# Patient Record
Sex: Female | Born: 1985 | Race: Black or African American | Hispanic: No | Marital: Single | Smoking: Never smoker
Health system: Southern US, Community
[De-identification: ages and names within clinical notes are randomized; demographics above are authoritative.]

---
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
## 2009-01-16 ENCOUNTER — Emergency Department (HOSPITAL_COMMUNITY): Admission: EM | Admit: 2009-01-16 | Discharge: 2009-01-16 | Payer: Self-pay | Admitting: Emergency Medicine

## 2018-05-19 ENCOUNTER — Emergency Department (HOSPITAL_COMMUNITY): Payer: Managed Care, Other (non HMO)

## 2018-05-19 ENCOUNTER — Emergency Department (HOSPITAL_COMMUNITY)
Admission: EM | Admit: 2018-05-19 | Discharge: 2018-05-19 | Disposition: A | Discharge status: Home / Self Care | Payer: Managed Care, Other (non HMO) | Attending: Emergency Medicine | Admitting: Emergency Medicine

## 2018-05-19 ENCOUNTER — Encounter (HOSPITAL_COMMUNITY): Payer: Self-pay | Admitting: Emergency Medicine

## 2018-05-19 DIAGNOSIS — E041 Nontoxic single thyroid nodule: Secondary | ICD-10-CM | POA: Diagnosis not present

## 2018-05-19 DIAGNOSIS — R1310 Dysphagia, unspecified: Secondary | ICD-10-CM | POA: Insufficient documentation

## 2018-05-19 LAB — BASIC METABOLIC PANEL
ANION GAP: 8 (ref 5–15)
BUN: 14 mg/dL (ref 6–20)
CALCIUM: 9 mg/dL (ref 8.9–10.3)
CO2: 24 mmol/L (ref 22–32)
CREATININE: 1.01 mg/dL — AB (ref 0.44–1.00)
Chloride: 102 mmol/L (ref 98–111)
GFR calc Af Amer: >60 mL/min (ref >60)
GFR calc non Af Amer: >60 mL/min (ref >60)
Glucose, Bld: 105 mg/dL — ABNORMAL HIGH (ref 70–99)
Potassium: 3.8 mmol/L (ref 3.5–5.1)
Sodium: 134 mmol/L — ABNORMAL LOW (ref 135–145)

## 2018-05-19 LAB — CBC WITH DIFFERENTIAL/PLATELET
Abs Immature Granulocytes: 0 10*3/uL (ref 0.0–0.1)
BASOS PCT: 1 %
Basophils Absolute: 0 10*3/uL (ref 0.0–0.1)
EOS ABS: 0.1 10*3/uL (ref 0.0–0.7)
Eosinophils Relative: 2 %
HEMATOCRIT: 41.1 % (ref 36.0–46.0)
Hemoglobin: 13.2 g/dL (ref 12.0–15.0)
IMMATURE GRANULOCYTES: 0 %
Lymphocytes Relative: 39 %
Lymphs Abs: 2 10*3/uL (ref 0.7–4.0)
MCH: 29.9 pg (ref 26.0–34.0)
MCHC: 32.1 g/dL (ref 30.0–36.0)
MCV: 93.2 fL (ref 78.0–100.0)
MONOS PCT: 13 %
Monocytes Absolute: 0.7 10*3/uL (ref 0.1–1.0)
NEUTROS PCT: 45 %
Neutro Abs: 2.4 10*3/uL (ref 1.7–7.7)
Platelets: 125 10*3/uL — ABNORMAL LOW (ref 150–400)
RBC: 4.41 MIL/uL (ref 3.87–5.11)
RDW: 12.8 % (ref 11.5–15.5)
WBC: 5.2 10*3/uL (ref 4.0–10.5)

## 2018-05-19 LAB — I-STAT BETA HCG BLOOD, ED (MC, WL, AP ONLY): I-stat hCG, quantitative: <5 m[IU]/mL (ref <5)

## 2018-05-19 MED ORDER — IOHEXOL 300 MG/ML  SOLN
75.0000 mL | Freq: Once | INTRAMUSCULAR | Status: AC | PRN
  Administered 2018-05-19: 100 mL via INTRAVENOUS

## 2018-05-19 NOTE — ED Notes (Signed)
Nurse started IV and collected labs. 

## 2018-05-19 NOTE — Discharge Instructions (Signed)
1.  Schedule follow-up appointment with Dr. Jenne PaneBates of ENT. 2.  You should also get established with a family physician.  Use referral number in your discharge instructions to find one. 3.  Return to the emergency department if her symptoms worsen or change.

## 2018-05-19 NOTE — ED Provider Notes (Signed)
MSE was initiated and I personally evaluated the patient and placed orders (if any) at  8:02 PM on May 19, 2018.  The patient appears stable so that the remainder of the MSE may be completed by another provider.  Patient placed in Quick Look pathway, seen and evaluated   Chief Complaint: cyst in throat  HPI:   Patient presents to ED for evaluation of feeling like there is a "cyst in my throat."  She reports having surgery to remove a cyst on her neck approximately 17 years ago.  States that she was told that the cyst was very large.  Today she began having pain with swallowing and feeling like there is something stuck in her throat.  She denies any sore throat, fever, cough, trouble breathing, trouble swallowing, trismus or drooling.  ROS: dysphagia  Physical Exam:   Gen: No distress  Neuro: Awake and Alert  Skin: Warm    Focused Exam: Well-healed scar on anterior neck.  No palpable mass in the area.  No signs of angioedema or anaphylaxis noted.  Tolerating secretions with no trismus, drooling.  No tonsillar enlargement bilaterally.  NAD.   Initiation of care has begun. The patient has been counseled on the process, plan, and necessity for staying for the completion/evaluation, and the remainder of the medical screening examination    Dietrich PatesKhatri, Rodolfo Gaster, PA-C 05/19/18 2004    Bethann BerkshireZammit, Joseph, MD 05/19/18 2140

## 2018-05-19 NOTE — ED Provider Notes (Signed)
[facility] EMERGENCY DEPARTMENT Provider Note   CSN: [id number] Arrival date & time: 05/19/18  1938     History   Chief Complaint Chief Complaint  Patient presents with  . Dysphagia    HPI Patricia Bradshaw is a 32 y.o. female.  HPI Patient will be started to feel a discomfort and lump-like sensation when she is swallowing.  She reports that she has a very distant history of a cyst in her neck that had to be removed.  She reports it feels similar.  No difficulty breathing.  No fever no chills no sore throat.  No earache.  No palpitations.  No unusual weight gain or weight loss.  No undue temperature sensitivity. History reviewed. No pertinent past medical history.  There are no active problems to display for this patient.   Past Surgical History:  Procedure Laterality Date  . CYST REMOVAL NECK  2002   cyst removed from anterior neck     OB History   None      Home Medications    Prior to Admission medications   Not on File    Family History No family history on file.  Social History Social History   Tobacco Use  . Smoking status: Never Smoker  . Smokeless tobacco: Never Used  Substance Use Topics  . Alcohol use: Yes    Frequency: Never    Comment: rare  . Drug use: Never     Allergies   Patient has no known allergies.   Review of Systems Review of Systems 10 Systems reviewed and are negative for acute change except as noted in the HPI.  Physical Exam Updated Vital Signs BP 106/71 (BP Location: Right Arm)   Pulse 78   Temp 98.9 F (37.2 C) (Oral)   Resp 17   Ht 5\' 6"  (1.676 m)   Wt 83.5 kg (184 lb)   LMP 04/28/2018 (Exact Date)   SpO2 100%   BMI 29.70 kg/m   Physical Exam  Constitutional: She is oriented to person, place, and time. She appears well-developed and well-nourished.  Is clinically well in appearance.  She is alert and nontoxic.  No respiratory distress.  Well-nourished well-developed.  HENT:  Head:  Normocephalic and atraumatic.  Posterior oropharynx widely patent.  Voice is normal.  His membranes pink and moist.  Eyes: EOM are normal.  No exophthalmos.  Neck: Neck supple.  Cannot significantly appreciate asymmetry or palpable enlargement of the thyroid.  His voice is clear with no stridor.  Cardiovascular: Normal rate, regular rhythm, normal heart sounds and intact distal pulses.  Pulmonary/Chest: Effort normal and breath sounds normal.  Abdominal: Soft. Bowel sounds are normal. She exhibits no distension. There is no tenderness.  Musculoskeletal: Normal range of motion. She exhibits no edema.  Neurological: She is alert and oriented to person, place, and time. She has normal strength. She exhibits normal muscle tone. Coordination normal. GCS eye subscore is 4. GCS verbal subscore is 5. GCS motor subscore is 6.  Skin: Skin is warm, dry and intact. No rash noted.  skin is in very good condition.  No abnormal dryness.  No rashes.  Psychiatric: She has a normal mood and affect.     ED Treatments / Results  Labs (all labs ordered are listed, but only abnormal results are displayed) Labs Reviewed  BASIC METABOLIC PANEL - Abnormal; Notable for the following components:      Result Value   Sodium 134 (*)    Glucose,  Bld 105 (*)    Creatinine, Ser 1.01 (*)    All other components within normal limits  CBC WITH DIFFERENTIAL/PLATELET - Abnormal; Notable for the following components:   Platelets 125 (*)    All other components within normal limits  I-STAT BETA HCG BLOOD, ED ([facility], [facility], [facility] ONLY)    EKG None  Radiology Ct Soft Tissue Neck W Contrast  Result Date: 05/19/2018 CLINICAL DATA:  32 y/o F; history of cyst removal 17 years ago. Pain with swallowing and feeling like something is stuck in her throat. EXAM: CT NECK WITH CONTRAST TECHNIQUE: Multidetector CT imaging of the neck was performed using the standard protocol following the bolus administration of intravenous contrast.  CONTRAST:  75 cc Omnipaque 300 COMPARISON:  None. FINDINGS: Pharynx and larynx: Normal. No mass or swelling. Salivary glands: No inflammation, mass, or stone. Thyroid: Thyroid nodules measuring up to 4.4 cm in the left lobe of the thyroid. The large nodule in the left lobe of the thyroid exerts mass effect on the airway and esophagus displacing the rightward. Postsurgical changes in the anterior neck adjacent to the thyroid bed. Lymph nodes: None enlarged or abnormal density. Vascular: Negative. Limited intracranial: Negative. Visualized orbits: Negative. Mastoids and visualized paranasal sinuses: Clear. Skeleton: No acute or aggressive process. Upper chest: Negative. Other: None. IMPRESSION: 1. Multiple thyroid nodules measuring up to 4.4 cm in the left lobe of the thyroid. Thyroid ultrasound is recommended on a nonemergent basis. The thyroid nodules exert mass effect on the trachea and esophagus displacing the rightward. 2. Postsurgical changes in the anterior neck adjacent to the thyroid bed, no recurrent cyst identified. Electronically Signed   By: Mitzi HansenLance  Furusawa-Stratton M.D.   On: 05/19/2018 21:38    Procedures Procedures (including critical care time)  Medications Ordered in ED Medications  iohexol (OMNIPAQUE) 300 MG/ML solution 75 mL (100 mLs Intravenous Contrast Given 05/19/18 2104)     Initial Impression / Assessment and Plan / ED Course  I have reviewed the triage vital signs and the nursing notes.  Pertinent labs & imaging results that were available during my care of the patient were reviewed by me and considered in my medical decision making (see chart for details).     Final Clinical Impressions(s) / ED Diagnoses   Final diagnoses:  Thyroid nodule  Dysphagia, unspecified type  Patient presents as outlined above.  CT confirms thyroid nodules in the left lobe with largest up to 4.4 cm.  Patient does not have any positives for hypo-or hyperthyroid on review of systems.   Clinically, she is in good condition.  She is well-nourished and well-developed with normal physical appearance, normal skin, no exophthalmos.  She has not been experiencing palpitations.  He does not have temperature sensitivity.  No evidence of airway compromise.  Patient does not have stridor or difficulty breathing.  He has started to perceive dysphagia.  Patient is counseled on outpatient plan of follow-up with ENT.  Return precautions reviewed.  ED Discharge Orders    None       Arby BarrettePfeiffer, Deondra Wigger, MD 05/19/18 2309

## 2018-05-19 NOTE — ED Triage Notes (Signed)
Patient to ED c/o dysphagia - reports she had a cyst removed from her neck in high school and feels something similar in same area. Reports discomfort and difficulty swallowing. Airway intact, resp e/u.

## 2018-11-15 IMAGING — CT CT NECK W/ CM
4 of 5 series · 15 of 33 positions shown, 18 images · IV contrast (Omni 300)
Comparison: None.

CLINICAL DATA: 32 y/o F; history of cyst removal 17 years ago. Pain
with swallowing and feeling like something is stuck in her throat.

EXAM:
CT NECK WITH CONTRAST
TECHNIQUE: Multidetector CT imaging of the neck was performed using the
standard protocol following the bolus administration of intravenous
contrast.
CONTRAST:  75 cc Omnipaque 300

[Series 3: neck 2.0 st · axial · 0.51mm/px · z∈[-240,-188]mm · 2 of 106 slices shown (1 of 3)]
[im 27/106  bone]
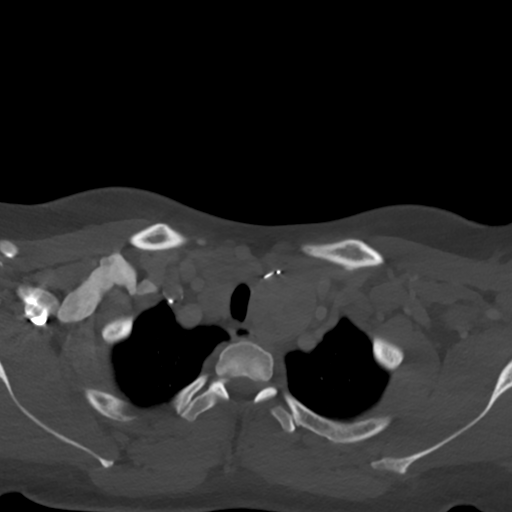
[im 53/106  bone]
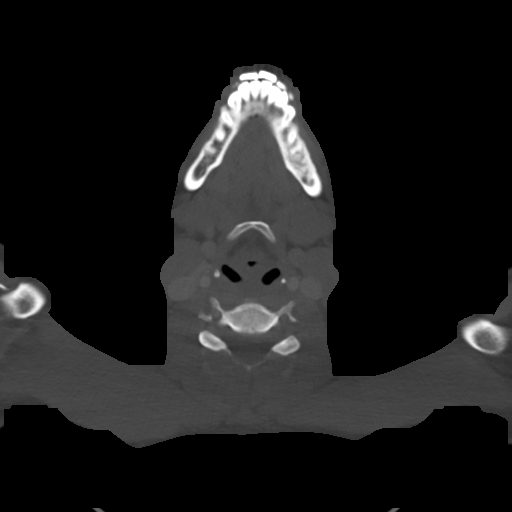

[Series 5: neck 2.0 st · sagittal · 0.41mm/px · 5 of 101 slices shown, 6 images (2 of 3)]
[im 34/101  bone]
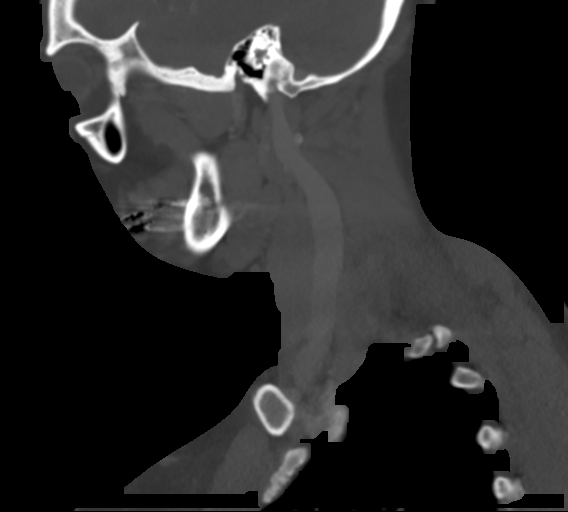
[im 42/101  bone]
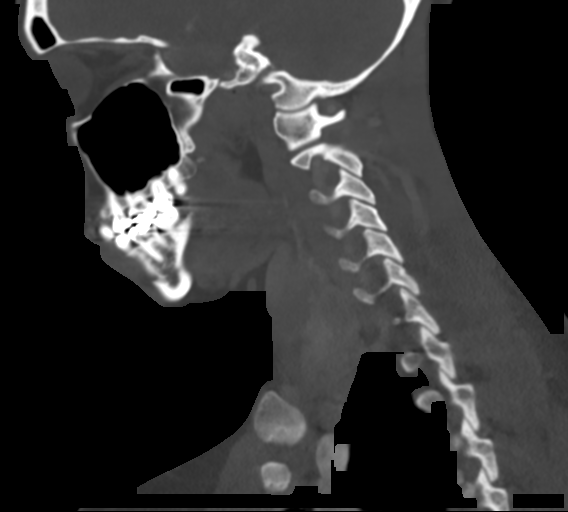
[im 51/101  soft-tissue]
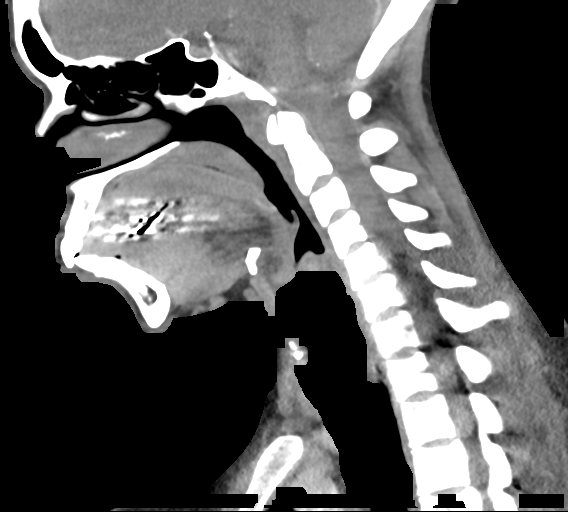
[im 51/101  bone]
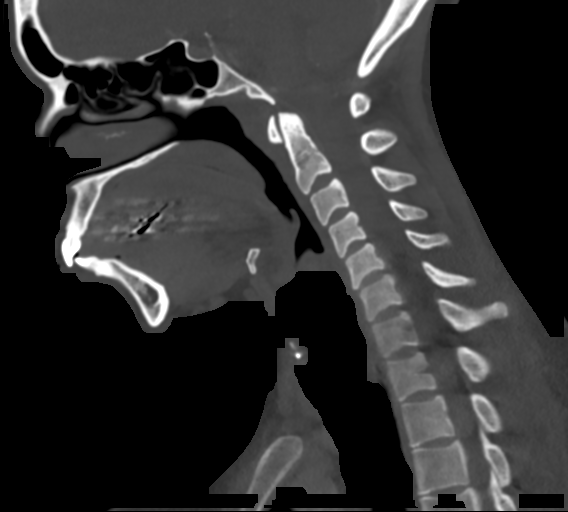
[im 59/101  bone]
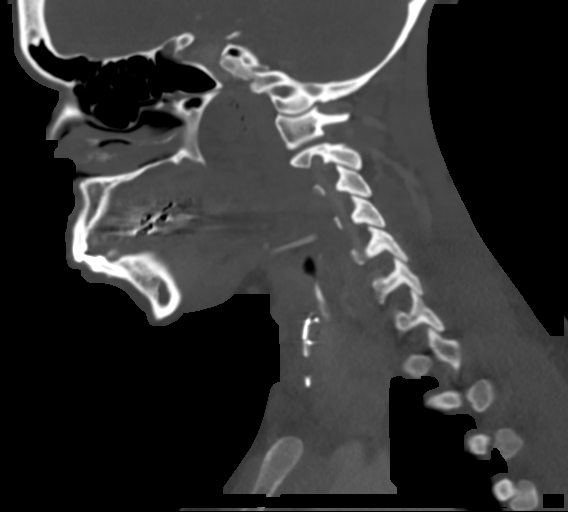
[im 67/101  bone]
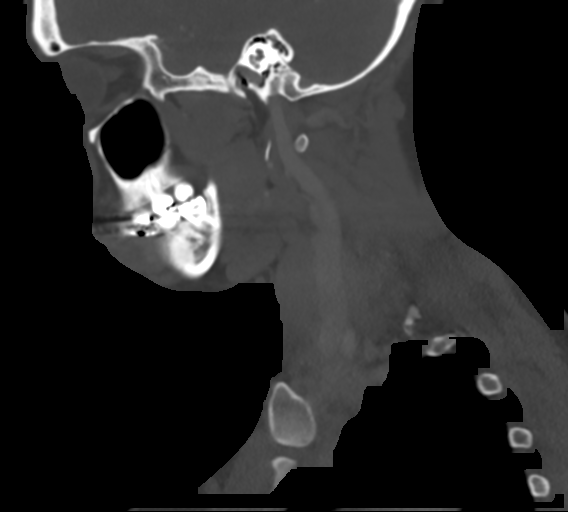

[Series 6: neck 2.0 st · coronal · 0.46mm/px · 3 of 99 slices shown (3 of 3)]
[im 20/99  bone]
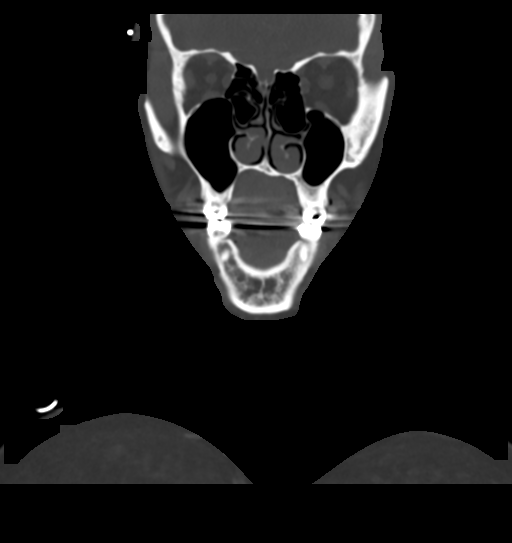
[im 40/99  bone]
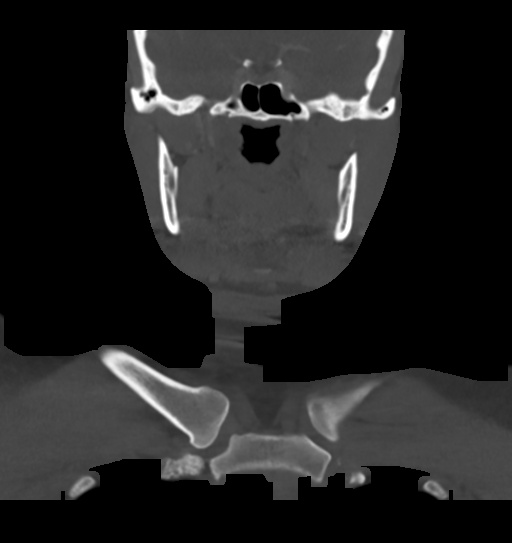
[im 59/99  bone]
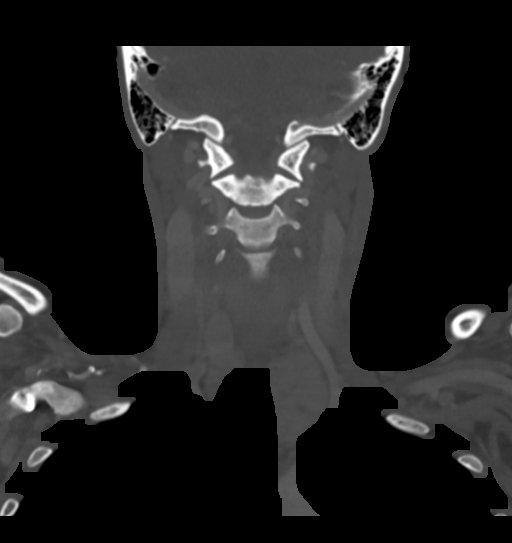

[Series 7: neck 2.0 st orthogonal · axial · 0.39mm/px · z∈[-287,-123]mm · 5 of 125 slices shown, 7 images]
[im 21/125  soft-tissue]
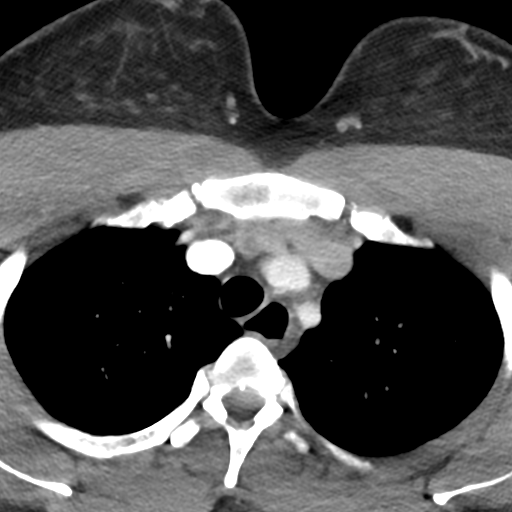
[im 21/125  bone]
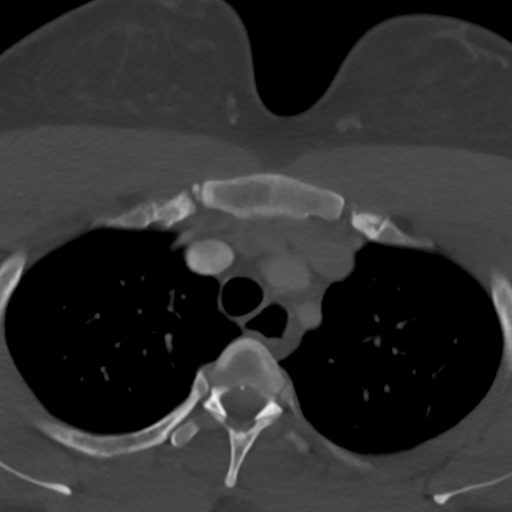
[im 42/125  bone]
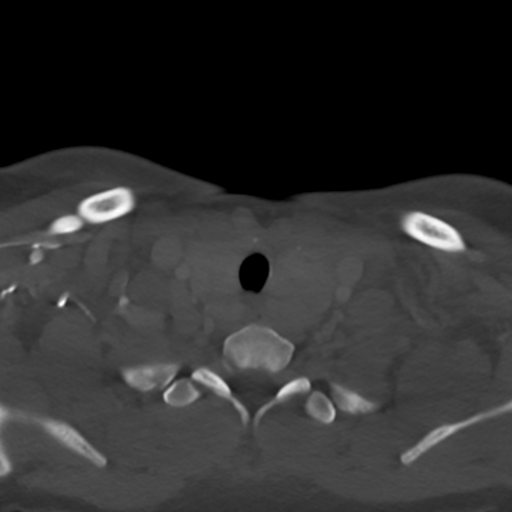
[im 63/125  bone]
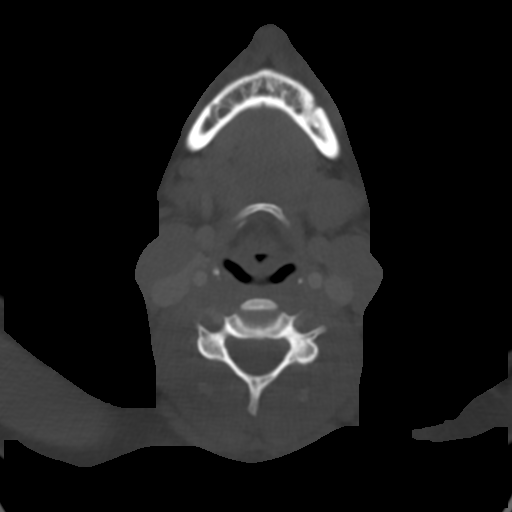
[im 83/125  bone]
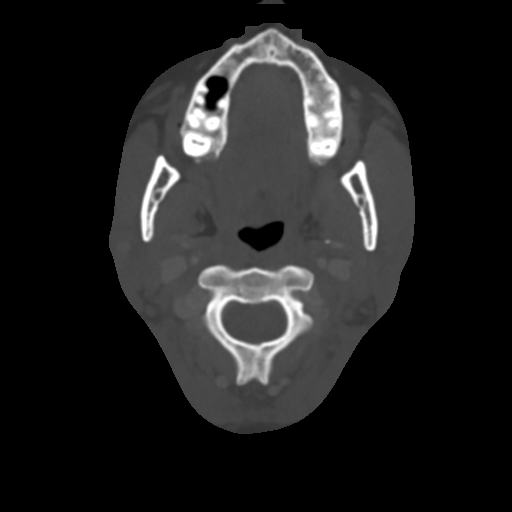
[im 104/125  soft-tissue]
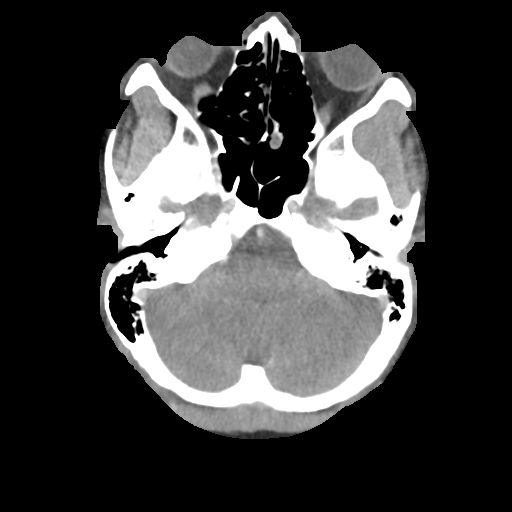
[im 104/125  bone]
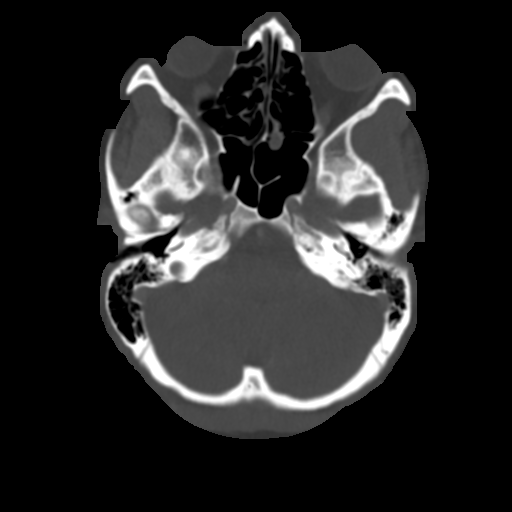

[15 of 33 positions shown; findings below may reference images not displayed]

FINDINGS: Pharynx and larynx: Normal. No mass or swelling.

Salivary glands: No inflammation, mass, or stone.

Thyroid: Thyroid nodules measuring up to 4.4 cm in the left lobe of
the thyroid. The large nodule in the left lobe of the thyroid exerts
mass effect on the airway and esophagus displacing the rightward.
Postsurgical changes in the anterior neck adjacent to the thyroid
bed.

Lymph nodes: None enlarged or abnormal density.

Vascular: Negative.

Limited intracranial: Negative.

Visualized orbits: Negative.

Mastoids and visualized paranasal sinuses: Clear.

Skeleton: No acute or aggressive process.

Upper chest: Negative.

Other: None.
IMPRESSION: 1. Multiple thyroid nodules measuring up to 4.4 cm in the left lobe
of the thyroid. Thyroid ultrasound is recommended on a nonemergent
basis. The thyroid nodules exert mass effect on the trachea and
esophagus displacing the rightward.
2. Postsurgical changes in the anterior neck adjacent to the thyroid
bed, no recurrent cyst identified.

By: Mahogany Gibson M.D.

## 2022-04-06 LAB — TSH: TSH: 100 — AB (ref 0.41–5.90)

## 2022-07-04 ENCOUNTER — Ambulatory Visit: Payer: Self-pay | Admitting: "Endocrinology

## 2022-07-18 ENCOUNTER — Encounter: Payer: Self-pay | Admitting: "Endocrinology

## 2022-07-18 ENCOUNTER — Ambulatory Visit (INDEPENDENT_AMBULATORY_CARE_PROVIDER_SITE_OTHER): Payer: Medicaid Other | Admitting: "Endocrinology

## 2022-07-18 VITALS — BP 88/64 | HR 80 | Ht 67.0 in | Wt 148.2 lb

## 2022-07-18 DIAGNOSIS — E782 Mixed hyperlipidemia: Secondary | ICD-10-CM

## 2022-07-18 DIAGNOSIS — E892 Postprocedural hypoparathyroidism: Secondary | ICD-10-CM

## 2022-07-18 DIAGNOSIS — E890 Postprocedural hypothyroidism: Secondary | ICD-10-CM | POA: Diagnosis not present

## 2022-07-18 MED ORDER — CALCIUM CARBONATE 1500 (600 CA) MG PO TABS
1.0000 | ORAL_TABLET | Freq: Three times a day (TID) | ORAL | 1 refills | Status: DC
Start: 2022-07-18 — End: 2022-09-25

## 2022-07-18 MED ORDER — LEVOTHYROXINE SODIUM 100 MCG PO TABS
100.0000 ug | ORAL_TABLET | Freq: Every morning | ORAL | 1 refills | Status: DC
Start: 2022-07-18 — End: 2022-09-25

## 2022-07-18 MED ORDER — CALCITRIOL 0.25 MCG PO CAPS: 0.2500 ug | ORAL_CAPSULE | Freq: Two times a day (BID) | ORAL | 1 refills | Status: DC

## 2022-07-18 NOTE — Progress Notes (Signed)
Endocrinology Consult Note                                            07/18/2022, 6:39 PM   Subjective:    Patient ID: Patricia Bradshaw, female    DOB: [birth year], PCP Willis Modena   History reviewed. No pertinent past medical history. Past Surgical History:  Procedure Laterality Date   CYST REMOVAL NECK  2002   cyst removed from anterior neck   Social History   Socioeconomic History   Marital status: Single    Spouse name: Not on file   Number of children: Not on file   Years of education: Not on file   Highest education level: Not on file  Occupational History   Not on file  Tobacco Use   Smoking status: Never   Smokeless tobacco: Never  Vaping Use   Vaping Use: Never used  Substance and Sexual Activity   Alcohol use: Not Currently    Comment: rare   Drug use: Never   Sexual activity: Not on file  Other Topics Concern   Not on file  Social History Narrative   Not on file   Social Determinants of Health   Financial Resource Strain: Not on file  Food Insecurity: Not on file  Transportation Needs: Not on file  Physical Activity: Not on file  Stress: Not on file  Social Connections: Not on file   Family History  Problem Relation Age of Onset   Hypertension Mother    Heart attack Mother    Hypertension Father    Outpatient Encounter Medications as of 07/18/2022  Medication Sig   Multiple Vitamin (MULTIVITAMIN ADULT PO) Take 1 tablet by mouth daily.   atorvastatin (LIPITOR) 10 MG tablet Take 10 mg by mouth daily.   calcitRIOL (ROCALTROL) 0.25 MCG capsule Take 1 capsule (0.25 mcg total) by mouth 2 (two) times daily.   calcium carbonate (OSCAL) 1500 (600 Ca) MG TABS tablet Take 1 tablet (1,500 mg total) by mouth 3 (three) times daily.   levothyroxine (SYNTHROID) 100 MCG tablet Take 1 tablet (100 mcg total) by mouth every morning.   [DISCONTINUED] calcitRIOL (ROCALTROL) 0.25 MCG capsule Take 0.25 mcg by mouth 2 (two) times daily.   [DISCONTINUED]  calcium carbonate (OSCAL) 1500 (600 Ca) MG TABS tablet Take 1 tablet by mouth 3 (three) times daily.   [DISCONTINUED] levothyroxine (SYNTHROID) 100 MCG tablet Take 100 mcg by mouth every morning.   No facility-administered encounter medications on file as of 07/18/2022.   ALLERGIES: No Known Allergies  VACCINATION STATUS:  There is no immunization history on file for this patient.  HPI Patricia Bradshaw is 36 y.o. female who presents today with a medical history as above.  She is accompanied by her sister to clinic.  History is obtained directly from the patient as well as chart review. she is being seen in consultation for postsurgical hypothyroidism requested by Willis Modena.  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  She was supposed to be on regular dose of levothyroxine subsequent to her surgery.  At the beginning of the year, she decided to come off of her thyroid hormone trying to "deal with it in a holistic way".  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  She also has hypocalcemia likely related  to her surgery.  She is currently taking her levothyroxine 100 mcg p.o. daily regularly along with her calcitriol . She still uses a walker for disequilibrium. She denies dysphagia, shortness of breath, nor voice change.  She denies palpitations, nor heat/cold intolerance. She has baseline mild tremors.  She denies any family history of thyroid dysfunction or thyroid malignancy.  Review of Systems  Constitutional: + Fluctuating body weight,  + fatigue, no subjective hyperthermia, no subjective hypothermia Eyes: no blurry vision, no xerophthalmia ENT: no sore throat, no nodules palpated in throat, no dysphagia/odynophagia, no hoarseness Cardiovascular: no Chest Pain, no Shortness of Breath, no palpitations, no leg swelling Respiratory: no cough, no shortness of breath Gastrointestinal: no  Nausea/Vomiting/Diarhhea Musculoskeletal: no muscle/joint aches Skin: no rashes Neurological: no tremors, no numbness, no tingling, no dizziness Psychiatric: no depression, no anxiety  Objective:       07/18/2022   11:09 AM 05/19/2018   11:23 PM 05/19/2018    7:50 PM  Vitals with BMI  Height 5\' 7"   5\' 6"   Weight 148 lbs 3 oz  184 lbs  BMI 23.21  29.71  Systolic 88 101   Diastolic 64 65   Pulse 80 65     BP (!) 88/64   Pulse 80   Ht 5\' 7"  (1.702 m)   Wt 148 lb 3.2 oz (67.2 kg)   BMI 23.21 kg/m   Wt Readings from Last 3 Encounters:  07/18/22 148 lb 3.2 oz (67.2 kg)  05/19/18 184 lb (83.5 kg)    Physical Exam  Constitutional:  Body mass index is 23.21 kg/m.,  not in acute distress, normal state of mind Eyes: PERRLA, EOMI, no exophthalmos ENT: moist mucous membranes, + old thyroidectomy scar on anterior lower neck,  no gross cervical lymphadenopathy Cardiovascular: normal precordial activity, Regular Rate and Rhythm, no Murmur/Rubs/Gallops Respiratory:  adequate breathing efforts, no gross chest deformity, Clear to auscultation bilaterally Gastrointestinal: abdomen soft, Non -tender, No distension, Bowel Sounds present, no gross organomegaly Musculoskeletal: + With a walker, no gross deformities, strength intact in all four extremities Skin: moist, warm, no rashes Neurological: no tremor with outstretched hands, Deep tendon reflexes normal in bilateral lower extremities.  CMP ( most recent) CMP     Component Value Date/Time   NA 134 (L) 05/19/2018 2010   K 3.8 05/19/2018 2010   CL 102 05/19/2018 2010   CO2 24 05/19/2018 2010   GLUCOSE 105 (H) 05/19/2018 2010   BUN 14 05/19/2018 2010   CREATININE 1.01 (H) 05/19/2018 2010   CALCIUM 9.0 05/19/2018 2010   GFRNONAA >60 05/19/2018 2010   GFRAA >60 05/19/2018 2010    On Apr 06, 2022 her TSH was 148, free T40.5. Her vitamin D 25 was 26.9, calcium was 8.1, PTH was 10, phosphorus 4.7     Assessment & Plan:   1.  Postsurgical hypothyroidism 2. Mixed hyperlipidemia 3. Hypocalcemia 4.  Postsurgical hypoparathyroidism   - Patricia Bradshaw  is being seen at a kind request of Willis Modena. - I have reviewed her available endocrine records and clinically evaluated the patient. - Based on these reviews, she has postsurgical hypothyroidism, postsurgical hypoparathyroidism with hypocalcemia. Unfortunately, patient withdrew her treatments at the beginning of the year which caused myxedema coma. I had a long discussion with the patient about the absolute necessity of this hormone and calcium supplement to avoid similar problems. Her current dose seems to be appropriate, advised to continue levothyroxine 100 mcg p.o. daily before breakfast.  - We discussed  about the correct intake of her thyroid hormone, on empty stomach at fasting, with water, separated by at least 30 minutes from breakfast and other medications,  and separated by more than 4 hours from calcium, iron, multivitamins, acid reflux medications (PPIs). -Patient is made aware of the fact that thyroid hormone replacement is needed for life, dose to be adjusted by periodic monitoring of thyroid function tests.  In light of her postsurgical hypoparathyroidism with hypocalcemia, she is advised to continue her calcium carbonate 1500 mg p.o. 3 times daily along with calcitriol 0.25 mcg p.o. twice daily. She does have hyperlipidemia, wishes to know other options of controlling lipids while she is taking atorvastatin 10 mg p.o. nightly.  She is offered lifestyle medicine. - she acknowledges that there is a room for improvement in her food and drink choices. - Suggestion is made for her to avoid simple carbohydrates  from her diet including Cakes, Sweet Desserts, Ice Cream, Soda (diet and regular), Sweet Tea, Candies, Chips, Cookies, Store Bought Juices, Alcohol , Artificial Sweeteners,  Coffee Creamer, and "Sugar-free" Products, Lemonade.  The following  Lifestyle Medicine recommendations according to American College of Lifestyle Medicine  [facility]) were discussed and and offered to patient and she  agrees to start the journey:  A. Whole Foods, Plant-Based Nutrition comprising of fruits and vegetables, plant-based proteins, whole-grain carbohydrates was discussed in detail with the patient.   A list for source of those nutrients were also provided to the patient.  Patient will use only water or unsweetened tea for hydration. B.  The need to stay away from risky substances including alcohol, smoking; obtaining 7 to 9 hours of restorative sleep, at least 150 minutes of moderate intensity exercise weekly, the importance of healthy social connections,  and stress management techniques were discussed. C.  A full color page of  Calorie density of various food groups per pound showing examples of each food groups was provided to the patient.  Her neck exam is negative, will not need thyroid imaging for now. - she is advised to maintain close follow up with Willis Modena for primary care needs.   - Time spent with the patient: 62 minutes, of which >50% was spent in  counseling her about her postsurgical hypothyroidism, for surgical hypoparathyroidism, hypocalcemia, vitamin D deficiency, hyperlipidemia and the rest in obtaining information about her symptoms, reviewing her previous labs/studies ( including abstractions from other facilities),  evaluations, and treatments,  and developing a plan to confirm diagnosis and long term treatment based on the latest standards of care/guidelines; and documenting her care.  Patricia Bradshaw participated in the discussions, expressed understanding, and voiced agreement with the above plans.  All questions were answered to her satisfaction. she is encouraged to contact clinic should she have any questions or concerns prior to her return visit.  Follow up plan: Return in about 9 weeks (around 09/19/2022) for Fasting Labs   in AM B4 8.   Marquis Lunch, MD [facility] [address] [place], [place] [address] Phone: [phone]  Fax: [phone]     07/18/2022, 6:39 PM  This note was partially dictated with voice recognition software. Similar sounding words can be transcribed inadequately or may not  be corrected upon review.

## 2022-07-19 ENCOUNTER — Other Ambulatory Visit: Payer: Self-pay

## 2022-07-19 DIAGNOSIS — E892 Postprocedural hypoparathyroidism: Secondary | ICD-10-CM

## 2022-07-19 MED ORDER — CALCITRIOL 0.25 MCG PO CAPS: 0.2500 ug | ORAL_CAPSULE | Freq: Two times a day (BID) | ORAL | 1 refills | Status: DC

## 2022-07-26 ENCOUNTER — Telehealth: Payer: Self-pay | Admitting: "Endocrinology

## 2022-07-26 NOTE — Telephone Encounter (Signed)
Received medical records request from [employer]. Scanned and sent to [employer] to release

## 2022-08-07 ENCOUNTER — Other Ambulatory Visit: Payer: Self-pay

## 2022-08-08 ENCOUNTER — Telehealth: Payer: Self-pay | Admitting: "Endocrinology

## 2022-08-08 NOTE — Telephone Encounter (Signed)
New message     1. Which medications need to be refilled? (please list name of each medication and dose if known) calcitRIOL (ROCALTROL) 0.25 MCG capsule  2. Which pharmacy/location (including street and city if local pharmacy) is medication to be sent to ? [phone]- phone - [facility] - fax # [phone]  3. Do they need a 30 day or 90 day supply? 30 day supply

## 2022-08-09 MED ORDER — CALCITRIOL 0.25 MCG PO CAPS: 0.2500 ug | ORAL_CAPSULE | Freq: Two times a day (BID) | ORAL | 1 refills | Status: DC

## 2022-08-09 NOTE — Telephone Encounter (Signed)
Rx refill sent.

## 2022-08-11 ENCOUNTER — Telehealth: Payer: Self-pay | Admitting: "Endocrinology

## 2022-08-11 NOTE — Telephone Encounter (Signed)
New message     1. Which medications need to be refilled? (please list name of each medication and dose if known) calcitRIOL (ROCALTROL) 0.25 MCG capsule  2. Which pharmacy/location (including street and city if local pharmacy) is medication to be sent to? [id number] [place], [address]  3. Do they need a 30 day or 90 day supply? 30 day supply

## 2022-08-11 NOTE — Telephone Encounter (Signed)
Rx refill sent on 08/09/22.

## 2022-08-21 NOTE — Telephone Encounter (Signed)
F/u   Family called voiced that [facility] is unable to get refill request.   The patient has not receive the first prescription.   [facility] is asking for [facility] to call and speak with pharmacy regarding medication   [id number] [place], [address]

## 2022-08-21 NOTE — Telephone Encounter (Signed)
Spoke with pt's sister and made her aware. Understanding voiced.

## 2022-08-21 NOTE — Telephone Encounter (Signed)
Called pharmacy and spoke with Eddie Dibbles, he stated they do have Rx for pt's calcitriol. Called pt's sister to maker her aware, she stated pt needs Rx for Vitamin D.

## 2022-08-21 NOTE — Telephone Encounter (Signed)
Left a message requesting [facility] in [place] to return call to the office.

## 2022-09-16 LAB — COMPREHENSIVE METABOLIC PANEL
ALT: 5 IU/L (ref 0–32)
AST: 17 IU/L (ref 0–40)
Albumin/Globulin Ratio: 1.5 (ref 1.2–2.2)
Albumin: 4.3 g/dL (ref 3.9–4.9)
Alkaline Phosphatase: 47 IU/L (ref 44–121)
BUN/Creatinine Ratio: 23 (ref 9–23)
BUN: 17 mg/dL (ref 6–20)
Bilirubin Total: 0.5 mg/dL (ref 0.0–1.2)
CO2: 21 mmol/L (ref 20–29)
Calcium: 6.5 mg/dL — CL (ref 8.7–10.2)
Chloride: 103 mmol/L (ref 96–106)
Creatinine, Ser: 0.74 mg/dL (ref 0.57–1.00)
Globulin, Total: 2.8 g/dL (ref 1.5–4.5)
Glucose: 81 mg/dL (ref 70–99)
Potassium: 3.9 mmol/L (ref 3.5–5.2)
Sodium: 140 mmol/L (ref 134–144)
Total Protein: 7.1 g/dL (ref 6.0–8.5)
eGFR: 107 mL/min/{1.73_m2} (ref >59)

## 2022-09-16 LAB — LIPID PANEL
Chol/HDL Ratio: 2.4 ratio (ref 0.0–4.4)
Cholesterol, Total: 183 mg/dL (ref 100–199)
HDL: 75 mg/dL (ref >39)
LDL Chol Calc (NIH): 97 mg/dL (ref 0–99)
Triglycerides: 60 mg/dL (ref 0–149)
VLDL Cholesterol Cal: 11 mg/dL (ref 5–40)

## 2022-09-16 LAB — VITAMIN B12: Vitamin B-12: 418 pg/mL (ref 232–1245)

## 2022-09-16 LAB — T4, FREE: Free T4: 0.34 ng/dL — ABNORMAL LOW (ref 0.82–1.77)

## 2022-09-16 LAB — VITAMIN D 25 HYDROXY (VIT D DEFICIENCY, FRACTURES): Vit D, 25-Hydroxy: 11.2 ng/mL — ABNORMAL LOW (ref 30.0–100.0)

## 2022-09-16 LAB — TSH: TSH: 274 u[IU]/mL — ABNORMAL HIGH (ref 0.450–4.500)

## 2022-09-18 ENCOUNTER — Telehealth: Payer: Self-pay | Admitting: Nurse Practitioner

## 2022-09-18 NOTE — Telephone Encounter (Signed)
Spoke with Patricia Bradshaw concerning her critical calcium level of 6.5. Also advised her that her TSH is 274. Pts sister states that she has not been taking any of her medications. Advised Patricia Bradshaw that she will need to go to the ER if she does not take any of her calcium supplements. She states that she ordered vegan calcium and that's all she is willing to take. She does not agree to take any of the meds prescribed by Dr Dorris Fetch. I did again advise she may need to go to the ER. Patricia Bradshaw disagreed and does not agree that the 6.5 is critically low. She also states that she has been doing research on a more natural alternative to Levothyroxine.

## 2022-09-25 ENCOUNTER — Ambulatory Visit (INDEPENDENT_AMBULATORY_CARE_PROVIDER_SITE_OTHER): Payer: Medicaid Other | Admitting: "Endocrinology

## 2022-09-25 ENCOUNTER — Encounter: Payer: Self-pay | Admitting: "Endocrinology

## 2022-09-25 VITALS — BP 98/68 | HR 80 | Ht 67.0 in

## 2022-09-25 DIAGNOSIS — Z91199 Patient's noncompliance with other medical treatment and regimen due to unspecified reason: Secondary | ICD-10-CM

## 2022-09-25 DIAGNOSIS — E890 Postprocedural hypothyroidism: Secondary | ICD-10-CM

## 2022-09-25 DIAGNOSIS — E782 Mixed hyperlipidemia: Secondary | ICD-10-CM

## 2022-09-25 DIAGNOSIS — E892 Postprocedural hypoparathyroidism: Secondary | ICD-10-CM

## 2022-09-25 MED ORDER — CALCITRIOL 0.25 MCG PO CAPS: 0.2500 ug | ORAL_CAPSULE | Freq: Two times a day (BID) | ORAL | 1 refills | Status: AC

## 2022-09-25 MED ORDER — SYNTHROID 100 MCG PO TABS: 100.0000 ug | ORAL_TABLET | Freq: Every day | ORAL | 1 refills | Status: DC

## 2022-09-25 NOTE — Progress Notes (Signed)
09/25/2022, 5:36 PM  Endocrinology follow-up note   Subjective:    Patient ID: Patricia Bradshaw, female    DOB: 12-26-1985, PCP Patricia Spanish, MD   History reviewed. No pertinent past medical history. Past Surgical History:  Procedure Laterality Date   CYST REMOVAL NECK  2002   cyst removed from anterior neck   Social History   Socioeconomic History   Marital status: Single    Spouse name: Not on file   Number of children: Not on file   Years of education: Not on file   Highest education level: Not on file  Occupational History   Not on file  Tobacco Use   Smoking status: Never   Smokeless tobacco: Never  Vaping Use   Vaping Use: Never used  Substance and Sexual Activity   Alcohol use: Not Currently    Comment: rare   Drug use: Never   Sexual activity: Not on file  Other Topics Concern   Not on file  Social History Narrative   Not on file   Social Determinants of Health   Financial Resource Strain: Not on file  Food Insecurity: Not on file  Transportation Needs: Not on file  Physical Activity: Not on file  Stress: Not on file  Social Connections: Not on file   Family History  Problem Relation Age of Onset   Hypertension Mother    Heart attack Mother    Hypertension Father    Outpatient Encounter Medications as of 09/25/2022  Medication Sig   calcium citrate (CALCITRATE - DOSED IN MG ELEMENTAL CALCIUM) 950 (200 Ca) MG tablet Take 200 mg of elemental calcium by mouth 3 (three) times daily with meals.   Multiple Vitamin (MULTIVITAMIN ADULT PO) Take 1 tablet by mouth daily.   Multiple Vitamins-Minerals (MULTIVITAMIN-MINERALS) TABS Take 2 tablets by mouth daily.   OVER THE COUNTER MEDICATION 1,000 mg daily. Calcium citrate complex 4 tablets daily   OVER THE COUNTER MEDICATION Vegan vitamin D3 135mg daily   SYNTHROID 100 MCG tablet Take 1 tablet (100 mcg total) by mouth daily before breakfast.    atorvastatin (LIPITOR) 10 MG tablet Take 10 mg by mouth daily. (Patient not taking: Reported on 09/25/2022)   calcitRIOL (ROCALTROL) 0.25 MCG capsule Take 1 capsule (0.25 mcg total) by mouth 2 (two) times daily.   Cholecalciferol 1.25 MG (50000 UT) capsule Take 1 capsule by mouth once a week. (Patient not taking: Reported on 09/25/2022)   [DISCONTINUED] calcitRIOL (ROCALTROL) 0.25 MCG capsule Take 1 capsule (0.25 mcg total) by mouth 2 (two) times daily. (Patient not taking: Reported on 09/25/2022)   [DISCONTINUED] calcium carbonate (OSCAL) 1500 (600 Ca) MG TABS tablet Take 1 tablet (1,500 mg total) by mouth 3 (three) times daily. (Patient not taking: Reported on 09/25/2022)   [DISCONTINUED] levothyroxine (SYNTHROID) 100 MCG tablet Take 1 tablet (100 mcg total) by mouth every morning. (Patient not taking: Reported on 09/25/2022)   No facility-administered encounter medications on file as of 09/25/2022.   ALLERGIES: No Known Allergies  VACCINATION STATUS:  There is no immunization history on file for this patient.  HPI Patricia CKIELEY AKTERis 36y.o. female who presents today for follow-up with repeat labs after she was seen in  consultation for postsurgical hypothyroidism, postsurgical hypoparathyroidism with hypocalcemia.  Tragically, over the last several weeks, she has not taken most of her medications including her levothyroxine, calcium supplements, calcitriol.  Her previsit labs showed severe hypocalcemia at 6.5 mg per DL for which she was advised to go to ER for evaluation however patient refused and did not address these deficits.  She denies cramps nor tremors at this time.  She has started calcium citrate 1000 mg 4 times a day after she learned about low calcium. This is not the first time she took herself off of her supplements. 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  She was supposed to be on regular dose of levothyroxine subsequent to her surgery.  At the  beginning of the year, she decided to come off of her thyroid hormone trying to "deal with it in a holistic way".  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  She also has hypocalcemia likely related to her surgery.   She still uses a walker for disequilibrium. She denies dysphagia, shortness of breath, nor voice change.  She denies palpitations, nor heat/cold intolerance. She has baseline mild tremors. -She has a tendency to minimize her symptoms.  She denies any family history of thyroid dysfunction or thyroid malignancy.  Review of Systems  Constitutional: + Fluctuating body weight,  + fatigue, no subjective hyperthermia, no subjective hypothermia Eyes: no blurry vision, no xerophthalmia ENT: no sore throat, no nodules palpated in throat, no dysphagia/odynophagia, no hoarseness   Objective:       09/25/2022    1:14 PM 07/18/2022   11:09 AM 05/19/2018   11:23 PM  Vitals with BMI  Height _0  _1    Weight  148 lbs 3 oz   BMI  63.89   Systolic 98 88 373  Diastolic 68 64 65  Pulse 80 80 65    BP 98/68   Pulse 80   Ht _2  (1.702 m)   BMI 23.21 kg/m   Wt Readings from Last 3 Encounters:  07/18/22 148 lb 3.2 oz (67.2 kg)  05/19/18 184 lb (83.5 kg)    Physical Exam  Constitutional:  Body mass index is 23.21 kg/m.,  not in acute distress, patient is difficult to engage with care conversations.  She is being cared for by her sister who seems to be frustrated as well due to patient's non- engagement. Eyes: PERRLA, EOMI, no exophthalmos  Musculoskeletal: + With a walker, no gross deformities, strength intact in all four extremities Skin: moist, warm, no rashes Neurological: no tremor with outstretched hands, Deep tendon reflexes normal in bilateral lower extremities.   On Apr 06, 2022 her TSH was 148, free T40.5. Her vitamin D 25 was 26.9, calcium was 8.1, PTH was 10, phosphorus 4.7  Recent Results (from the past 2160 hour(s))   Comprehensive metabolic panel     Status: Abnormal   Collection Time: 09/15/22  2:58 PM  Result Value Ref Range   Glucose 81 70 - 99 mg/dL   BUN 17 6 - 20 mg/dL   Creatinine, Ser 0.74 0.57 - 1.00 mg/dL   eGFR 107 >59 mL/min/1.73   BUN/Creatinine Ratio 23 9 - 23   Sodium 140 134 - 144 mmol/L   Potassium 3.9 3.5 - 5.2 mmol/L   Chloride 103 96 - 106 mmol/L   CO2 21 20 - 29 mmol/L   Calcium 6.5 (LL) 8.7 - 10.2 mg/dL    Comment: **  Verified by repeat analysis**   Total Protein 7.1 6.0 - 8.5 g/dL   Albumin 4.3 3.9 - 4.9 g/dL   Globulin, Total 2.8 1.5 - 4.5 g/dL   Albumin/Globulin Ratio 1.5 1.2 - 2.2   Bilirubin Total 0.5 0.0 - 1.2 mg/dL   Alkaline Phosphatase 47 44 - 121 IU/L   AST 17 0 - 40 IU/L   ALT 5 0 - 32 IU/L  TSH     Status: Abnormal   Collection Time: 09/15/22  2:58 PM  Result Value Ref Range   TSH 274.000 (H) 0.450 - 4.500 uIU/mL    Comment: Results confirmed on dilution.   T4, free     Status: Abnormal   Collection Time: 09/15/22  2:58 PM  Result Value Ref Range   Free T4 0.34 (L) 0.82 - 1.77 ng/dL  VITAMIN D 25 Hydroxy (Vit-D Deficiency, Fractures)     Status: Abnormal   Collection Time: 09/15/22  2:58 PM  Result Value Ref Range   Vit D, 25-Hydroxy 11.2 (L) 30.0 - 100.0 ng/mL    Comment: Vitamin D deficiency has been defined by the [place] practice guideline as a level of serum 25-OH vitamin D less than 20 ng/mL (1,2). The Endocrine Society went on to further define vitamin D insufficiency as a level between 21 and 29 ng/mL (2). 1. IOM (Institute of Medicine). 2010. Dietary reference    intakes for calcium and D. [place]: The    [employer]. 2. Holick MF, Binkley [place], Bischoff-Ferrari HA, et al.    Evaluation, treatment, and prevention of vitamin D    deficiency: an Endocrine Society clinical practice    guideline. JCEM. 2011 Jul; 96(7):1911-30.   Vitamin B12     Status: None   Collection Time: 09/15/22   2:58 PM  Result Value Ref Range   Vitamin B-12 418 232 - 1,245 pg/mL  Lipid panel     Status: None   Collection Time: 09/15/22  2:58 PM  Result Value Ref Range   Cholesterol, Total 183 100 - 199 mg/dL   Triglycerides 60 0 - 149 mg/dL   HDL 75 >39 mg/dL   VLDL Cholesterol Cal 11 5 - 40 mg/dL   LDL Chol Calc (NIH) 97 0 - 99 mg/dL   Chol/HDL Ratio 2.4 0.0 - 4.4 ratio    Comment:                                   T. Chol/HDL Ratio                                             Men  Women                               1/2 Avg.Risk  3.4    3.3                                   Avg.Risk  5.0    4.4  2X Avg.Risk  9.6    7.1                                3X Avg.Risk 23.4   11.0       Assessment & Plan:   1. 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 Mixed hyperlipidemia-uncontrolled due to patient noncompliance 3. Hypocalcemia-unstable due to patient noncompliance 4.  Postsurgical hypoparathyroidism   - I have reviewed her new and existing endocrine records and clinically evaluated the patient. - Based on these reviews, she has postsurgical hypothyroidism, postsurgical hypoparathyroidism with hypocalcemia.  She also has hypovitaminosis D. Unfortunately, patient remains noncompliant/nonadherent to treatment regimens.  She recently withdrew her important medications just like she did at the beginning of the year when she did have myxedema coma.    I had a long discussion with the patient about the absolute necessity of this thyroid hormone hormone and calcium supplement to avoid similar problems. She would like to have primary and thyroid hormone.  I discussed and prescribed Synthroid 100 mcg p.o. daily before breakfast.  - We discussed about the correct intake of her thyroid hormone, on empty stomach at fasting, with water, separated by at least 30 minutes from breakfast and other medications,  and separated by more than 4 hours from  calcium, iron, multivitamins, acid reflux medications (PPIs). -Patient is made aware of the fact that thyroid hormone replacement is needed for life, dose to be adjusted by periodic monitoring of thyroid function tests.  In light of her postsurgical hypoparathyroidism with hypocalcemia, she refused to take calcium carbonate.  She has found calcium citrate over-the-counter, encouraged to continue to take it 1000 mg p.o. 3 times daily AC.  She is advised to have CMP next week and CMP before her next visit.  She insists that 6.5 mg per DL of calcium is not low.  I had a discussion with convince her that her calcium needs to be at least low normal between 7.5-8.5 to avoid hospitalization for severe hypocalcemia.  She does not seem to be convinced and patient is at risk of mismanaging her medications.  She is frustrating her caring sister as well.  She will benefit from mental health evaluation. I also encouraged her to take her calcitriol 0.25 mcg p.o. twice daily.  She is encouraged to continue vitamin D3 5000 units daily.  She is interested in legal and lifestyle.  She is advised to continue atorvastatin 10 mg p.o. nightly until she engages with whole food plant-based diet. She is at high risk of hospitalization and due to hypoglycemia and consider glaucoma.  These are discussed in detail with her and her sister.   Her neck exam is negative, will not need thyroid imaging for now. - she is advised to maintain close follow up with Patricia Spanish, MD for primary care needs.    I spent 32 minutes in the care of the patient today including review of labs from Thyroid Function, CMP, and other relevant labs ; imaging/biopsy records (current and previous including abstractions from other facilities); face-to-face time discussing  her lab results and symptoms, medications doses, her options of short and long term treatment based on the latest standards of care / guidelines;   and documenting the  encounter.  Chales Salmon  participated in the discussions, expressed understanding, and voiced agreement with the above plans.  All questions were answered to her satisfaction. she is encouraged to  contact clinic should she have any questions or concerns prior to her return visit.   Follow up plan: Return in about 3 months (around 12/26/2022).   Glade Lloyd, MD [facility] [address] [place], [place] [address] Phone: [phone]  Fax: [phone]     09/25/2022, 5:36 PM  This note was partially dictated with voice recognition software. Similar sounding words can be transcribed inadequately or may not  be corrected upon review.

## 2022-10-18 ENCOUNTER — Telehealth: Payer: Self-pay

## 2022-10-18 NOTE — Telephone Encounter (Signed)
Pt left vm to call her back

## 2022-10-18 NOTE — Telephone Encounter (Signed)
Left a message requesting pt's sister Rinaldo Cloud) return call to the office.

## 2022-10-18 NOTE — Telephone Encounter (Signed)
Spoke with pt's sister, advised her that pt's lab orders are still active. Understanding voiced.

## 2022-10-25 ENCOUNTER — Telehealth: Payer: Self-pay

## 2022-10-25 NOTE — Telephone Encounter (Signed)
Pt's sister called stating pt would like to change synthroid back to levothyroxine due to cost. Pt contact # [phone]

## 2022-10-26 ENCOUNTER — Other Ambulatory Visit: Payer: Self-pay | Admitting: "Endocrinology

## 2022-10-26 MED ORDER — LEVOTHYROXINE SODIUM 100 MCG PO TABS: 100.0000 ug | ORAL_TABLET | Freq: Every day | ORAL | 1 refills | Status: DC

## 2022-10-30 NOTE — Telephone Encounter (Signed)
Dr.Nida sent in Rx for levothyroxine as requested.

## 2022-11-11 LAB — COMPREHENSIVE METABOLIC PANEL
ALT: 9 IU/L (ref 0–32)
AST: 26 IU/L (ref 0–40)
Albumin/Globulin Ratio: 1.2 (ref 1.2–2.2)
Albumin: 4 g/dL (ref 3.9–4.9)
Alkaline Phosphatase: 50 IU/L (ref 44–121)
BUN/Creatinine Ratio: 13 (ref 9–23)
BUN: 13 mg/dL (ref 6–20)
Bilirubin Total: 0.5 mg/dL (ref 0.0–1.2)
CO2: 24 mmol/L (ref 20–29)
Calcium: 8 mg/dL — ABNORMAL LOW (ref 8.7–10.2)
Chloride: 102 mmol/L (ref 96–106)
Creatinine, Ser: 0.99 mg/dL (ref 0.57–1.00)
Globulin, Total: 3.4 g/dL (ref 1.5–4.5)
Glucose: 82 mg/dL (ref 70–99)
Potassium: 3.8 mmol/L (ref 3.5–5.2)
Sodium: 140 mmol/L (ref 134–144)
Total Protein: 7.4 g/dL (ref 6.0–8.5)
eGFR: 75 mL/min/{1.73_m2} (ref >59)

## 2022-12-26 ENCOUNTER — Ambulatory Visit: Payer: Medicaid Other | Admitting: "Endocrinology

## 2023-01-26 ENCOUNTER — Other Ambulatory Visit: Payer: Self-pay | Admitting: "Endocrinology

## 2023-01-27 LAB — COMPREHENSIVE METABOLIC PANEL
ALT: 10 IU/L (ref 0–32)
AST: 18 IU/L (ref 0–40)
Albumin/Globulin Ratio: 1.4 (ref 1.2–2.2)
Albumin: 4.1 g/dL (ref 3.9–4.9)
Alkaline Phosphatase: 45 IU/L (ref 44–121)
BUN/Creatinine Ratio: 19 (ref 9–23)
BUN: 18 mg/dL (ref 6–20)
Bilirubin Total: 0.4 mg/dL (ref 0.0–1.2)
CO2: 23 mmol/L (ref 20–29)
Calcium: 7.5 mg/dL — ABNORMAL LOW (ref 8.7–10.2)
Chloride: 103 mmol/L (ref 96–106)
Creatinine, Ser: 0.94 mg/dL (ref 0.57–1.00)
Globulin, Total: 3 g/dL (ref 1.5–4.5)
Glucose: 88 mg/dL (ref 70–99)
Potassium: 3.9 mmol/L (ref 3.5–5.2)
Sodium: 141 mmol/L (ref 134–144)
Total Protein: 7.1 g/dL (ref 6.0–8.5)
eGFR: 80 mL/min/{1.73_m2} (ref >59)

## 2023-01-27 LAB — T4, FREE: Free T4: 0.38 ng/dL — ABNORMAL LOW (ref 0.82–1.77)

## 2023-01-27 LAB — LIPID PANEL
Chol/HDL Ratio: 2.3 ratio (ref 0.0–4.4)
Cholesterol, Total: 198 mg/dL (ref 100–199)
HDL: 86 mg/dL (ref >39)
LDL Chol Calc (NIH): 103 mg/dL — ABNORMAL HIGH (ref 0–99)
Triglycerides: 47 mg/dL (ref 0–149)
VLDL Cholesterol Cal: 9 mg/dL (ref 5–40)

## 2023-01-27 LAB — TSH: TSH: 236 u[IU]/mL — ABNORMAL HIGH (ref 0.450–4.500)

## 2023-01-29 ENCOUNTER — Ambulatory Visit (INDEPENDENT_AMBULATORY_CARE_PROVIDER_SITE_OTHER): Payer: Medicaid Other | Admitting: "Endocrinology

## 2023-01-29 ENCOUNTER — Encounter: Payer: Self-pay | Admitting: "Endocrinology

## 2023-01-29 VITALS — BP 96/54 | HR 76 | Ht 67.0 in | Wt 177.2 lb

## 2023-01-29 DIAGNOSIS — E890 Postprocedural hypothyroidism: Secondary | ICD-10-CM | POA: Diagnosis not present

## 2023-01-29 DIAGNOSIS — E892 Postprocedural hypoparathyroidism: Secondary | ICD-10-CM | POA: Diagnosis not present

## 2023-01-29 DIAGNOSIS — Z91199 Patient's noncompliance with other medical treatment and regimen due to unspecified reason: Secondary | ICD-10-CM

## 2023-01-29 DIAGNOSIS — E782 Mixed hyperlipidemia: Secondary | ICD-10-CM

## 2023-01-29 MED ORDER — LEVOTHYROXINE SODIUM 100 MCG PO TABS: 100.0000 ug | ORAL_TABLET | Freq: Every day | ORAL | 1 refills | Status: AC

## 2023-01-29 NOTE — Progress Notes (Signed)
01/29/2023, 4:09 PM  Endocrinology follow-up note   Subjective:    Patient ID: Patricia Bradshaw, female    DOB: [birth year], PCP Percival Spanish, MD   History reviewed. No pertinent past medical history. Past Surgical History:  Procedure Laterality Date   CYST REMOVAL NECK  2002   cyst removed from anterior neck   Social History   Socioeconomic History   Marital status: Single    Spouse name: Not on file   Number of children: Not on file   Years of education: Not on file   Highest education level: Not on file  Occupational History   Not on file  Tobacco Use   Smoking status: Never   Smokeless tobacco: Never  Vaping Use   Vaping Use: Never used  Substance and Sexual Activity   Alcohol use: Not Currently    Comment: rare   Drug use: Never   Sexual activity: Not on file  Other Topics Concern   Not on file  Social History Narrative   Not on file   Social Determinants of Health   Financial Resource Strain: Not on file  Food Insecurity: Not on file  Transportation Needs: Not on file  Physical Activity: Not on file  Stress: Not on file  Social Connections: Not on file   Family History  Problem Relation Age of Onset   Hypertension Mother    Heart attack Mother    Hypertension Father    Outpatient Encounter Medications as of 01/29/2023  Medication Sig   atorvastatin (LIPITOR) 10 MG tablet Take 10 mg by mouth daily.   calcitRIOL (ROCALTROL) 0.25 MCG capsule Take 1 capsule (0.25 mcg total) by mouth 2 (two) times daily.   calcium citrate (CALCITRATE - DOSED IN MG ELEMENTAL CALCIUM) 950 (200 Ca) MG tablet Take 200 mg of elemental calcium by mouth 3 (three) times daily with meals.   levothyroxine (SYNTHROID) 100 MCG tablet Take 1 tablet (100 mcg total) by mouth daily before breakfast. (Patient not taking: Reported on 01/29/2023)   Multiple Vitamin (MULTIVITAMIN ADULT PO) Take 1 tablet by mouth daily.   Multiple  Vitamins-Minerals (MULTIVITAMIN-MINERALS) TABS Take 2 tablets by mouth daily.   OVER THE COUNTER MEDICATION 1,000 mg daily. Calcium citrate complex 4 tablets daily   OVER THE COUNTER MEDICATION Vegan vitamin D3 138mg daily   No facility-administered encounter medications on file as of 01/29/2023.   ALLERGIES: No Known Allergies  VACCINATION STATUS:  There is no immunization history on file for this patient.  HPI Patricia CTROIAN BURRISis 37y.o. female who presents today for follow-up with repeat labs after she was seen in consultation for postsurgical hypothyroidism, postsurgical hypoparathyroidism with hypocalcemia.  During her last visit in November 2023 she agreed to initiate her on levothyroxine 100 mcg p.o. daily before breakfast.  However, tragically, she has not been taking this medication even though she filled it. She is accompanied by her sister who corroborates this history.  Patient did have myxedema coma in the past which led to her hospitalization.  She denies cramps nor tremors at this time.  She is more or less assistant taking her calcium citrate, and calcitriol.  Her previsit labs show calcium of 7.5.  However, her  free T4 0.38, TSH is 236.   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  She was supposed to be on regular dose of levothyroxine subsequent to her surgery.  She did have a habit of completely stopping her medication in an attempt to  "deal with it in a holistic way".  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   She still uses a walker for disequilibrium. She denies dysphagia, shortness of breath, nor voice change.  She denies palpitations, nor heat/cold intolerance. She has baseline mild tremors. -She has a tendency to minimize her symptoms.  She denies any family history of thyroid dysfunction or thyroid malignancy.  Review of Systems  Constitutional: + Fluctuating body weight,  + fatigue, no  subjective hyperthermia, no subjective hypothermia Eyes: no blurry vision, no xerophthalmia ENT: no sore throat, no nodules palpated in throat, no dysphagia/odynophagia, no hoarseness   Objective:       01/29/2023   11:35 AM 09/25/2022    1:14 PM 07/18/2022   11:09 AM  Vitals with BMI  Height '5\' 7"'$  '5\' 7"'$  '5\' 7"'$   Weight 177 lbs 3 oz  148 lbs 3 oz  BMI [id number]  [id number]  Systolic 96 98 88  Diastolic 54 68 64  Pulse 76 80 80    BP (!) 96/54   Pulse 76   Ht '5\' 7"'$  (1.702 m)   Wt 177 lb 3.2 oz (80.4 kg)   BMI 27.75 kg/m   Wt Readings from Last 3 Encounters:  01/29/23 177 lb 3.2 oz (80.4 kg)  07/18/22 148 lb 3.2 oz (67.2 kg)  05/19/18 184 lb (83.5 kg)    Physical Exam  Constitutional:  Body mass index is 27.75 kg/m.,  not in acute distress, patient is difficult to engage with care conversations.  She is being cared for by her sister who seems to be frustrated as well due to patient's non- engagement. Eyes: PERRLA, EOMI, no exophthalmos     On Apr 06, 2022 her TSH was 148, free T40.5. Her vitamin D 25 was 26.9, calcium was 8.1, PTH was 10, phosphorus 4.7  Recent Results (from the past 2160 hour(s))  Comprehensive metabolic panel     Status: Abnormal   Collection Time: 11/10/22  2:37 PM  Result Value Ref Range   Glucose 82 70 - 99 mg/dL   BUN 13 6 - 20 mg/dL   Creatinine, Ser 0.99 0.57 - 1.00 mg/dL   eGFR 75 >59 mL/min/1.73   BUN/Creatinine Ratio 13 9 - 23   Sodium 140 134 - 144 mmol/L   Potassium 3.8 3.5 - 5.2 mmol/L   Chloride 102 96 - 106 mmol/L   CO2 24 20 - 29 mmol/L   Calcium 8.0 (L) 8.7 - 10.2 mg/dL   Total Protein 7.4 6.0 - 8.5 g/dL   Albumin 4.0 3.9 - 4.9 g/dL   Globulin, Total 3.4 1.5 - 4.5 g/dL   Albumin/Globulin Ratio 1.2 1.2 - 2.2   Bilirubin Total 0.5 0.0 - 1.2 mg/dL   Alkaline Phosphatase 50 44 - 121 IU/L   AST 26 0 - 40 IU/L   ALT 9 0 - 32 IU/L  Comprehensive metabolic panel     Status: Abnormal   Collection Time: 01/26/23  1:39 PM  Result Value Ref  Range   Glucose 88 70 - 99 mg/dL   BUN 18 6 - 20 mg/dL   Creatinine, Ser 0.94 0.57 - 1.00 mg/dL   eGFR  80 >59 mL/min/1.73   BUN/Creatinine Ratio 19 9 - 23   Sodium 141 134 - 144 mmol/L   Potassium 3.9 3.5 - 5.2 mmol/L   Chloride 103 96 - 106 mmol/L   CO2 23 20 - 29 mmol/L   Calcium 7.5 (L) 8.7 - 10.2 mg/dL   Total Protein 7.1 6.0 - 8.5 g/dL   Albumin 4.1 3.9 - 4.9 g/dL   Globulin, Total 3.0 1.5 - 4.5 g/dL   Albumin/Globulin Ratio 1.4 1.2 - 2.2   Bilirubin Total 0.4 0.0 - 1.2 mg/dL   Alkaline Phosphatase 45 44 - 121 IU/L   AST 18 0 - 40 IU/L   ALT 10 0 - 32 IU/L  Lipid panel     Status: Abnormal   Collection Time: 01/26/23  1:39 PM  Result Value Ref Range   Cholesterol, Total 198 100 - 199 mg/dL   Triglycerides 47 0 - 149 mg/dL   HDL 86 >39 mg/dL   VLDL Cholesterol Cal 9 5 - 40 mg/dL   LDL Chol Calc (NIH) 103 (H) 0 - 99 mg/dL   Chol/HDL Ratio 2.3 0.0 - 4.4 ratio    Comment:                                   T. Chol/HDL Ratio                                             Men  Women                               1/2 Avg.Risk  3.4    3.3                                   Avg.Risk  5.0    4.4                                2X Avg.Risk  9.6    7.1                                3X Avg.Risk 23.4   11.0   T4, free     Status: Abnormal   Collection Time: 01/26/23  1:39 PM  Result Value Ref Range   Free T4 0.38 (L) 0.82 - 1.77 ng/dL  TSH     Status: Abnormal   Collection Time: 01/26/23  1:39 PM  Result Value Ref Range   TSH 236.000 (H) 0.450 - 4.500 uIU/mL    Comment: Results confirmed on dilution.       Assessment & Plan:   1. 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 Mixed hyperlipidemia-uncontrolled due to patient noncompliance 3. Hypocalcemia-unstable due to patient noncompliance 4.  Postsurgical hypoparathyroidism   - I have reviewed her new and existing endocrine records and clinically evaluated the patient. - Based on these reviews, she  has postsurgical hypothyroidism, postsurgical hypoparathyroidism with hypocalcemia.  She also has hypovitaminosis D. Unfortunately, patient remains noncompliant/nonadherent to treatment regimens.  Once again, she withdrew her important thyroid hormone replacement despite  the fact that a year ago she did have myxedema coma.    I had another long discussion with her and her sister in the room about the importance of this hormone replacement.  She voices understanding and promises to restart.  She is advised to start her levothyroxine 100 mcg p.o. daily before breakfast.   - We discussed about the correct intake of her thyroid hormone, on empty stomach at fasting, with water, separated by at least 30 minutes from breakfast and other medications,  and separated by more than 4 hours from calcium, iron, multivitamins, acid reflux medications (PPIs). -Patient is made aware of the fact that thyroid hormone replacement is needed for life, dose to be adjusted by periodic monitoring of thyroid function tests.   In light of her postsurgical hypoparathyroidism with hypocalcemia, her recent labs show calcium 7.5.  She does have prior history of inconsistency taking her supplements.  She is currently on calcitriol 0.25 mcg p.o. daily, calcium citrate 950 mg with 200 mg of elemental calcium 3 times a day with meals.   I had a discussion with the sisters to convince her that her calcium needs to be at least low normal between 7.5-8.5 to avoid hospitalization for severe hypocalcemia.  She does not seem to be convinced and patient is at risk of mismanaging her medications.  She is frustrating her caring sister as well.  She will benefit from mental health evaluation.  She does have a general dislike and suspicion for medications. -  She is encouraged to continue vitamin D3 5000 units daily.  She has hyperlipidemia, she is advised to continue atorvastatin 10 mg p.o. nightly until she engages with whole food plant-based  diet. She is at high risk of hospitalization and due to hypocalcemia as well as myxedema.  These are discussed in detail with her and her sister.   Her neck exam is negative except for remote past thyroidectomy scar, will not need thyroid imaging for now. - she is advised to maintain close follow up with Percival Spanish, MD for primary care needs.  I spent  26  minutes in the care of the patient today including review of labs from Thyroid Function, CMP, and other relevant labs ; imaging/biopsy records (current and previous including abstractions from other facilities); face-to-face time discussing  her lab results and symptoms, medications doses, her options of short and long term treatment based on the latest standards of care / guidelines;   and documenting the encounter.  Patricia Bradshaw  participated in the discussions, expressed understanding, and voiced agreement with the above plans.  All questions were answered to her satisfaction. she is encouraged to contact clinic should she have any questions or concerns prior to her return visit.    Follow up plan: Return in about 4 weeks (around 02/26/2023) for F/U with Pre-visit Labs.   Glade Lloyd, MD [facility] [address] [place], [place] [address] Phone: [phone]  Fax: [phone]     01/29/2023, 4:09 PM  This note was partially dictated with voice recognition software. Similar sounding words can be transcribed inadequately or may not  be corrected upon review.

## 2023-02-28 ENCOUNTER — Ambulatory Visit: Payer: Medicaid Other | Admitting: "Endocrinology

## 2023-04-23 ENCOUNTER — Ambulatory Visit: Payer: Medicaid Other | Admitting: "Endocrinology

## 2023-05-29 ENCOUNTER — Telehealth: Payer: Self-pay | Admitting: "Endocrinology

## 2023-05-29 NOTE — Telephone Encounter (Signed)
Faxed Medical Request to [facility] from [facility]

## 2024-02-06 ENCOUNTER — Other Ambulatory Visit: Payer: Self-pay | Admitting: "Endocrinology

## 2024-02-07 ENCOUNTER — Other Ambulatory Visit: Payer: Self-pay | Admitting: "Endocrinology
